# Patient Record
Sex: Male | Born: 1995 | Race: White | Hispanic: No | Marital: Single | Smoking: Never smoker
Health system: Southern US, Community
[De-identification: ages and names within clinical notes are randomized; demographics above are authoritative.]

## PROBLEM LIST (undated history)

## (undated) DIAGNOSIS — K2950 Unspecified chronic gastritis without bleeding: Secondary | ICD-10-CM

## (undated) DIAGNOSIS — J45909 Unspecified asthma, uncomplicated: Secondary | ICD-10-CM

## (undated) HISTORY — DX: Unspecified asthma, uncomplicated: J45.909

## (undated) HISTORY — PX: NO PAST SURGERIES: SHX2092

## (undated) HISTORY — DX: Unspecified chronic gastritis without bleeding: K29.50

---

## 2016-03-17 ENCOUNTER — Encounter: Payer: Self-pay | Admitting: Gastroenterology

## 2016-03-23 ENCOUNTER — Telehealth: Payer: Self-pay | Admitting: Gastroenterology

## 2016-03-23 NOTE — Telephone Encounter (Signed)
The pt's mom wanted to sure we have the records for the pt are here.  I have confirmed we do have the records and Shanda BumpsJessica will have access for the appt.

## 2016-03-27 ENCOUNTER — Encounter (INDEPENDENT_AMBULATORY_CARE_PROVIDER_SITE_OTHER): Payer: Self-pay

## 2016-03-27 ENCOUNTER — Ambulatory Visit (INDEPENDENT_AMBULATORY_CARE_PROVIDER_SITE_OTHER): Payer: 59 | Admitting: Gastroenterology

## 2016-03-27 ENCOUNTER — Encounter: Payer: Self-pay | Admitting: Gastroenterology

## 2016-03-27 ENCOUNTER — Other Ambulatory Visit: Payer: 59

## 2016-03-27 VITALS — BP 94/60 | HR 88 | Ht 67.75 in | Wt 128.5 lb

## 2016-03-27 DIAGNOSIS — R634 Abnormal weight loss: Secondary | ICD-10-CM | POA: Diagnosis not present

## 2016-03-27 DIAGNOSIS — K219 Gastro-esophageal reflux disease without esophagitis: Secondary | ICD-10-CM

## 2016-03-27 DIAGNOSIS — R109 Unspecified abdominal pain: Secondary | ICD-10-CM | POA: Diagnosis not present

## 2016-03-27 DIAGNOSIS — R197 Diarrhea, unspecified: Secondary | ICD-10-CM | POA: Diagnosis not present

## 2016-03-27 MED ORDER — DIPHENOXYLATE-ATROPINE 2.5-0.025 MG PO TABS: 1.0000 | ORAL_TABLET | Freq: Four times a day (QID) | ORAL | 0 refills | Status: AC | PRN

## 2016-03-27 MED ORDER — OMEPRAZOLE 40 MG PO CPDR: 40.0000 mg | DELAYED_RELEASE_CAPSULE | Freq: Two times a day (BID) | ORAL | 2 refills | Status: AC

## 2016-03-27 NOTE — Progress Notes (Signed)
03/27/2016 Roy Kelley [id number] 1996/01/05   HISTORY OF PRESENT ILLNESS:  This is a 21 year old male who is new to our office. [place] is from [place] and just underwent extensive GI evaluation there in January. He is a Consulting civil engineerstudent at [employer] so he is coming here to establish care for continued follow-up. His mother is with him today. We have records from his recent evaluation. He complains of diarrhea with associated weight loss of 25 pounds since October. Has abdominal pain both in lower abdomen and upper abdomen. Also has complaints of chest pain. He tells me that all of his symptoms started in the late fall. His evaluation includes an EGD that was performed on January 24 that showed mildly severe reflux esophagitis, chronic gastritis. It appears that biopsies were obtained from the esophagus and the stomach but I do not yet have those results. He was placed on omeprazole 40 mg twice daily and told to continue that for couple of months and then back down to once daily (has been on it for about a month now). CBC, CMP, TSH and remaining thyroid studies, sedimentation rate, CRP were all within normal limits. He also had IBD panel performed and that was negative. He did have a celiac panel drawn as well and that was somewhat abnormal. His transglutaminase IgA was slightly elevated at 5 but his transglutaminase IgG was elevated at 19 and his antigliadin antibody IgG was significantly elevated at 51. He also had an abdominal ultrasound performed that was unremarkable. Same day as his endoscopy he presented to the emergency department with complaints of severe abdominal and chest pain. He underwent a CT scan of the chest, abdomen, and pelvis. This showed mucosal thickening of a short segment of proximal jejunum compatible with enteritis, but was otherwise unremarkable. They thought that his upper abdominal pain and chest pain may have been a flare of his reflux type symptoms.  His abdominal pain is better  currently.  He says that every time he eats he has diarrhea.  He does smoke marijuana as well.  Patient admits to some nausea but denies any vomiting with his other symptoms.   Past Medical History:  Diagnosis Date  . Asthma   . Gastritis, chronic    No past surgical history on file.  reports that he has never smoked. He has never used smokeless tobacco. He reports that he drinks alcohol. He reports that he uses drugs, including Marijuana. family history includes Colon polyps in his father. Not on File    No outpatient encounter prescriptions on file as of 03/27/2016.   No facility-administered encounter medications on file as of 03/27/2016.     REVIEW OF SYSTEMS  : All other systems reviewed and negative except where noted in the History of Present Illness.  PHYSICAL EXAM: Ht 5' 7.75" (1.721 m) Comment: height measured without shoes  Wt 128 lb 8 oz (58.3 kg)   BMI 19.68 kg/m  General: Well developed white male in no acute distress Head: Normocephalic and atraumatic Eyes:  Sclerae anicteric, conjunctiva pink. Ears: Normal auditory acuity Lungs: Clear throughout to auscultation Heart: Regular rate and rhythm Abdomen: Soft, non-distended. Normal bowel sounds.  Mild epigastric TTP. Musculoskeletal: Symmetrical with no gross deformities  Skin: No lesions on visible extremities Extremities: No edema  Neurological: Alert oriented x 4, grossly non-focal Psychological:  Alert and cooperative. Normal mood and affect  ASSESSMENT AND PLAN: -21 year old male with complaints of abdominal pain, chest pain, diarrhea, and weight loss:  Had extensive evaluation in [place].  Had esophagitis on EGD and celiac labs slightly elevated.  Inflammatory markers, IBD serologies normal/negative.  CT scan suggested some thickening in the jejunum but remainder of the bowel was normal.  Celiac serologies actually slightly elevated.  We don't have the pathology results from his EGD so we are requesting those but  it doesn't appear that they obtained duodenal specimens.  At this point I am going to order stool studies to rule out Cdiff, parasites, and Giardia since he has not had any stool studies performed.  I am going to have him follow-up the CT scan with a SBS to see if there is resolution of the small bowel thickening.  Will await EGD biopsy results.  I would like him to follow a gluten free diet for the next several weeks to see if his symptoms improve/resolve.  He would like to see a dietician to discuss this further as well.  Can use Imodium prn for diarrhea pending stool studies are negative.  Will give a prescription for lomotil as well to use if needed. -GERD:  Continue prilose 40 mg BID for another month and then decreased to once daily.  *Will follow-up in approximately 8 weeks with Dr. Russella Dar.  CC:  No ref. provider found

## 2016-03-27 NOTE — Patient Instructions (Addendum)
If you are age 21 or older, your body mass index should be between 23-30. Your Body mass index is 19.68 kg/m. If this is out of the aforementioned range listed, please consider follow up with your Primary Care Provider.  If you are age 21 or younger, your body mass index should be between 19-25. Your Body mass index is 19.68 kg/m. If this is out of the aformentioned range listed, please consider follow up with your Primary Care Provider.   You have been scheduled for an Upper GI Series and Small Bowel Follow Thru at [facility]. Your appointment is on 04-14-2016 at 11am. Please arrive 15 minutes prior to your test for registration. Make certain not to have anything to eat or drink after midnight on the night before your test. If you need to reschedule, please contact radiology at [phone]. --------------------------------------------------------------------------------------------------------------- An upper GI series uses x rays to help diagnose problems of the upper GI tract, which includes the esophagus, stomach, and duodenum. The duodenum is the first part of the small intestine. An upper GI series is conducted by a radiology technologist or a radiologist-a doctor who specializes in x-ray imaging-at a hospital or outpatient center. While sitting or standing in front of an x-ray machine, the patient drinks barium liquid, which is often white and has a chalky consistency and taste. The barium liquid coats the lining of the upper GI tract and makes signs of disease show up more clearly on x rays. X-ray video, called fluoroscopy, is used to view the barium liquid moving through the esophagus, stomach, and duodenum. Additional x rays and fluoroscopy are performed while the patient lies on an x-ray table. To fully coat the upper GI tract with barium liquid, the technologist or radiologist may press on the abdomen or ask the patient to change position. Patients hold still in various positions,  allowing the technologist or radiologist to take x rays of the upper GI tract at different angles. If a technologist conducts the upper GI series, a radiologist will later examine the images to look for problems.  This test typically takes about 1 hour to complete --------------------------------------------------------------------------------------------------------------------------------------------- The Small Bowel Follow Thru examination is used to visualize the entire small bowel (intestines); specifically the connection between the small and large intestine. You will be positioned on a flat x-ray table and an image of your abdomen taken. Then the technologist will show the x-ray to the radiologist. The radiologist will instruct your technologist how much (1-2 cups) barium sulfate you will drink and when to begin taking the timed x-rays, usually 15-30 minutes after you begin drinking. Barium is a harmless substance that will highlight your small intestine by absorbing x-ray. The taste is chalky and it feels very heavy both in the cup and in your stomach.  After the first x-ray is taken and shown to the radiologist, he/she will determine when the next image is to be taken. This is repeated until the barium has reached the end of the small intestine and enters the beginning of the colon (cecum). At such time when the barium spills into the colon, you will be positioned on the x-ray table once again. The radiologist will use a fluoroscopic camera to take some detailed pictures of the connection between your small intestine and colon. The fluoroscope is an x-ray unit that works with a television/computer screen. The radiologist will apply pressure to your abdomen with his/her hand and a lead glove, a plastic paddle, or a paddle with an inflated rubber  balloon on the end. This is to spread apart your loops of intestine so he/she can see all areas.   This test typically takes around 1 hour to  complete.  Important----- Drink plenty of water (8-10 cups/day) for a few days following the procedure to avoid constipation and blockage. The barium will make your stools white for a few days. --------------------------------------------------------------------------------------------------------------------------------------------  We will send your records to the [facility]. They will call to schedule your appointment.  Please read over Gluten free diet.  Thank you for choosing [facility]

## 2016-03-28 ENCOUNTER — Telehealth: Payer: Self-pay

## 2016-03-28 ENCOUNTER — Other Ambulatory Visit: Payer: Self-pay

## 2016-03-28 DIAGNOSIS — K219 Gastro-esophageal reflux disease without esophagitis: Secondary | ICD-10-CM

## 2016-03-28 DIAGNOSIS — R197 Diarrhea, unspecified: Secondary | ICD-10-CM

## 2016-03-28 NOTE — Telephone Encounter (Signed)
Referral request sent on 03-28-2016. Will await appointment information.

## 2016-03-29 ENCOUNTER — Encounter: Payer: Self-pay | Admitting: Gastroenterology

## 2016-03-29 DIAGNOSIS — K219 Gastro-esophageal reflux disease without esophagitis: Secondary | ICD-10-CM | POA: Insufficient documentation

## 2016-03-29 DIAGNOSIS — R634 Abnormal weight loss: Secondary | ICD-10-CM | POA: Insufficient documentation

## 2016-03-29 DIAGNOSIS — R197 Diarrhea, unspecified: Secondary | ICD-10-CM | POA: Insufficient documentation

## 2016-03-29 DIAGNOSIS — R109 Unspecified abdominal pain: Secondary | ICD-10-CM | POA: Insufficient documentation

## 2016-04-04 ENCOUNTER — Ambulatory Visit (HOSPITAL_COMMUNITY): Payer: 59

## 2016-04-06 NOTE — Telephone Encounter (Signed)
Pt. is scheduled to see the dietician on 04-12-2016.

## 2016-04-12 ENCOUNTER — Ambulatory Visit: Payer: 59 | Admitting: Registered"

## 2016-04-14 ENCOUNTER — Ambulatory Visit (HOSPITAL_COMMUNITY)
Admission: RE | Admit: 2016-04-14 | Discharge: 2016-04-14 | Disposition: A | Discharge status: Home / Self Care | Payer: 59 | Source: Ambulatory Visit | Attending: Gastroenterology | Admitting: Gastroenterology

## 2016-04-14 DIAGNOSIS — R197 Diarrhea, unspecified: Secondary | ICD-10-CM | POA: Diagnosis present

## 2016-04-14 DIAGNOSIS — R634 Abnormal weight loss: Secondary | ICD-10-CM

## 2016-04-14 DIAGNOSIS — K219 Gastro-esophageal reflux disease without esophagitis: Secondary | ICD-10-CM | POA: Diagnosis present

## 2016-04-14 DIAGNOSIS — R109 Unspecified abdominal pain: Secondary | ICD-10-CM | POA: Insufficient documentation

## 2016-05-22 ENCOUNTER — Ambulatory Visit: Payer: 59 | Admitting: Gastroenterology

## 2016-05-22 ENCOUNTER — Telehealth: Payer: Self-pay

## 2016-05-22 NOTE — Telephone Encounter (Signed)
-----   Message from Maudry Mayhew sent at 05/22/2016 10:38 AM EDT ----- Regarding: Pt canceled appt for today 4.16.18  pt states he has been vommiting all morning and can not make it to appt today...will cb once he checks his school sch

## 2016-05-22 NOTE — Telephone Encounter (Signed)
No charge this time. 

## 2016-05-22 NOTE — Telephone Encounter (Signed)
Do you want to charge? 

## 2016-12-22 ENCOUNTER — Emergency Department: Payer: 59

## 2016-12-22 ENCOUNTER — Emergency Department
Admission: EM | Admit: 2016-12-22 | Discharge: 2016-12-22 | Disposition: A | Discharge status: Home / Self Care | Payer: 59 | Attending: Emergency Medicine | Admitting: Emergency Medicine

## 2016-12-22 ENCOUNTER — Encounter: Payer: Self-pay | Admitting: Emergency Medicine

## 2016-12-22 ENCOUNTER — Other Ambulatory Visit: Payer: Self-pay

## 2016-12-22 DIAGNOSIS — J45909 Unspecified asthma, uncomplicated: Secondary | ICD-10-CM | POA: Diagnosis not present

## 2016-12-22 DIAGNOSIS — R1012 Left upper quadrant pain: Secondary | ICD-10-CM | POA: Diagnosis present

## 2016-12-22 LAB — COMPREHENSIVE METABOLIC PANEL
ALT: 24 U/L (ref 17–63)
AST: 22 U/L (ref 15–41)
Albumin: 4.9 g/dL (ref 3.5–5.0)
Alkaline Phosphatase: 82 U/L (ref 38–126)
Anion gap: 7 (ref 5–15)
BUN: 14 mg/dL (ref 6–20)
CO2: 27 mmol/L (ref 22–32)
Calcium: 9.2 mg/dL (ref 8.9–10.3)
Chloride: 102 mmol/L (ref 101–111)
Creatinine, Ser: 0.73 mg/dL (ref 0.61–1.24)
GFR calc Af Amer: >60 mL/min (ref >60)
GFR calc non Af Amer: >60 mL/min (ref >60)
Glucose, Bld: 96 mg/dL (ref 65–99)
Potassium: 3.9 mmol/L (ref 3.5–5.1)
Sodium: 136 mmol/L (ref 135–145)
Total Bilirubin: 0.5 mg/dL (ref 0.3–1.2)
Total Protein: 8.3 g/dL — ABNORMAL HIGH (ref 6.5–8.1)

## 2016-12-22 LAB — CBC WITH DIFFERENTIAL/PLATELET
Basophils Absolute: 0 10*3/uL (ref 0–0.1)
Basophils Relative: 1 %
Eosinophils Absolute: 0.1 10*3/uL (ref 0–0.7)
Eosinophils Relative: 2 %
HCT: 44 % (ref 40.0–52.0)
Hemoglobin: 14.5 g/dL (ref 13.0–18.0)
Lymphocytes Relative: 26 %
Lymphs Abs: 1.8 10*3/uL (ref 1.0–3.6)
MCH: 29.9 pg (ref 26.0–34.0)
MCHC: 32.9 g/dL (ref 32.0–36.0)
MCV: 90.9 fL (ref 80.0–100.0)
Monocytes Absolute: 0.6 10*3/uL (ref 0.2–1.0)
Monocytes Relative: 9 %
Neutro Abs: 4.4 10*3/uL (ref 1.4–6.5)
Neutrophils Relative %: 62 %
Platelets: 301 10*3/uL (ref 150–440)
RBC: 4.83 MIL/uL (ref 4.40–5.90)
RDW: 13.4 % (ref 11.5–14.5)
WBC: 6.9 10*3/uL (ref 3.8–10.6)

## 2016-12-22 LAB — MONONUCLEOSIS SCREEN: Mono Screen: NEGATIVE

## 2016-12-22 MED ORDER — IOPAMIDOL (ISOVUE-300) INJECTION 61%
100.0000 mL | Freq: Once | INTRAVENOUS | Status: AC | PRN
  Administered 2016-12-22: 100 mL via INTRAVENOUS

## 2016-12-22 MED ORDER — SODIUM CHLORIDE 0.9 % IV BOLUS (SEPSIS)
1000.0000 mL | Freq: Once | INTRAVENOUS | Status: AC
  Administered 2016-12-22: 1000 mL via INTRAVENOUS

## 2016-12-22 MED ORDER — SODIUM CHLORIDE 0.9 % IV SOLN: 1000.0000 mL | INTRAVENOUS | Status: DC

## 2016-12-22 NOTE — ED Triage Notes (Signed)
Arrives c/o fatigue for the past few weeks.  Also c/o left upper quadrant pain x 2 weeks.  [facility], who sent patient for ED evaluation due to enlarged spleen.  Patient c/o generalized pain and aches.  Also back pain.  Patient [place] to fly to [place] tonight.

## 2016-12-22 NOTE — ED Provider Notes (Signed)
[facility] Emergency Department Provider Note   ____________________________________________    I have reviewed the triage vital signs and the nursing notes.   HISTORY  Chief Complaint Abdominal Pain    HPI Roy Kelley is a 21 y.o. male who presents with complaints of left upper abdominal pain and fatigue.  Patient reports 2-4 weeks of significant fatigue.  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  He has had intermittent sore throats as well.  Today he went to urgent care and was recommended to come to the emergency department to evaluate whether his spleen is enlarged.  He describes aching pain in his left upper quadrant which occasionally radiates to his left mid back.  Sore throat today, intermittent fevers.  No nausea or vomiting.  Normal stools.  Scheduled to travel to [place] today   Past Medical History:  Diagnosis Date  . Asthma   . Gastritis, chronic     Patient Active Problem List   Diagnosis Date Noted  . Diarrhea 03/29/2016  . Abdominal pain 03/29/2016  . Loss of weight 03/29/2016  . Gastroesophageal reflux disease 03/29/2016    Past Surgical History:  Procedure Laterality Date  . NO PAST SURGERIES      Prior to Admission medications   Medication Sig Start Date End Date Taking? Authorizing Provider  diphenoxylate-atropine (LOMOTIL) 2.5-0.025 MG tablet Take 1 tablet by mouth every 6 (six) hours as needed for diarrhea or loose stools. Patient not taking: Reported on 12/22/2016 03/27/16   Zehr, Princella PellegriniJessica D, PA-C  omeprazole (PRILOSEC) 40 MG capsule Take 1 capsule (40 mg total) by mouth 2 (two) times daily. Patient not taking: Reported on 12/22/2016 03/27/16   Zehr, Princella PellegriniJessica D, PA-C     Allergies Patient has no known allergies.  Family History  Problem Relation Age of Onset  . Colon polyps Father   . Stroke Paternal Grandmother   . Breast cancer Paternal Grandmother     Social History Social History   Tobacco Use  . Smoking status:  Never Smoker  . Smokeless tobacco: Never Used  Substance Use Topics  . Alcohol use: Yes    Comment: occasional  . Drug use: Yes    Types: Marijuana    Review of Systems  Constitutional: Some chills Eyes: No visual changes.  ENT: As above. Cardiovascular: Denies chest pain. Respiratory: Denies shortness of breath. Gastrointestinal:   No nausea, no vomiting.   Genitourinary: Negative for dysuria.  No hematuria, no urinary symptoms Musculoskeletal: As above Skin: Negative for rash. Neurological: Negative for headaches   ____________________________________________   PHYSICAL EXAM:  VITAL SIGNS: ED Triage Vitals  Enc Vitals Group     BP 12/22/16 1020 118/72     Pulse Rate 12/22/16 1020 73     Resp 12/22/16 1020 18     Temp 12/22/16 1020 97.8 F (36.6 C)     Temp Source 12/22/16 1020 Oral     SpO2 12/22/16 1020 98 %     Weight 12/22/16 1025 63.5 kg (140 lb)     Height 12/22/16 1025 1.727 m (5\' 8" )     Head Circumference --      Peak Flow --      Pain Score 12/22/16 1025 6     Pain Loc --      Pain Edu? --      Excl. in GC? --     Constitutional: Alert and oriented. No acute distress. Pleasant and interactive Eyes: Conjunctivae are normal.   Nose: No  congestion/rhinnorhea. Mouth/Throat: Mucous membranes are moist.   Neck:  Painless ROM Cardiovascular: Normal rate, regular rhythm. Grossly normal heart sounds.  Good peripheral circulation. Respiratory: Normal respiratory effort.  No retractions. Lungs CTAB. Gastrointestinal: Mild tenderness left upper quadrant, slightly enlarged spleen. no distention.  No CVA tenderness. Genitourinary: deferred Musculoskeletal:   Warm and well perfused Neurologic:  Normal speech and language. No gross focal neurologic deficits are appreciated.  Skin:  Skin is warm, dry and intact. No rash noted. Psychiatric: Mood and affect are normal. Speech and behavior are normal.  ____________________________________________   LABS (all  labs ordered are listed, but only abnormal results are displayed)  Labs Reviewed  COMPREHENSIVE METABOLIC PANEL - Abnormal; Notable for the following components:      Result Value   Total Protein 8.3 (*)    All other components within normal limits  CBC WITH DIFFERENTIAL/PLATELET  MONONUCLEOSIS SCREEN   ____________________________________________  EKG  None ____________________________________________  RADIOLOGY  Ct abd/pel negative ____________________________________________   PROCEDURES  Procedure(s) performed: No  Procedures   Critical Care performed: No ____________________________________________   INITIAL IMPRESSION / ASSESSMENT AND PLAN / ED COURSE  Pertinent labs & imaging results that were available during my care of the patient were reviewed by me and considered in my medical decision making (see chart for details).  Patient overall well-appearing in no acute distress.  Labs overall well-appearing, mono screen pending.  Certainly clinical picture and H&P is consistent with mononucleosis, will give IV fluids, check urine and chest x-ray as well.  May require imaging  ----------------------------------------- 1:10 PM on 12/22/2016 -----------------------------------------  Mono screen negative, still with some discomfort in the left upper abdomen, will image for further evaluation  ----------------------------------------- 1:45 PM on 12/22/2016 -----------------------------------------  CT abdomen pelvis is normal.  Patient feels well remains very well-appearing.  Presentation is strongly consistent with mononucleosis/viral illness.  Recommend supportive care, outpatient follow-up, return to ED if any worsening.    ____________________________________________   FINAL CLINICAL IMPRESSION(S) / ED DIAGNOSES  Final diagnoses:  Left upper quadrant pain      NEW MEDICATIONS STARTED DURING THIS VISIT:  This SmartLink is deprecated. Use AVSMEDLIST  instead to display the medication list for a patient.   Note:  This document was prepared using Dragon voice recognition software and may include unintentional dictation errors.    Jene EveryKinner, Betzayda Braxton, MD 12/22/16 1346

## 2016-12-22 NOTE — Discharge Instructions (Addendum)
Please follow [facility] with [facility]. Return to the ED if your symptoms worsen.   Your blood work and CT scan were entirely normal today. Your mono screen was negative.

## 2016-12-22 NOTE — ED Notes (Signed)
Patient does not appear to be in any acute distress at time of discharge. Patient ambulatory to lobby with steady gate. Patient denies any comments or concerns regarding discharge.  

## 2019-02-21 IMAGING — CR DG CHEST 2V
2 series · 2 of 2 positions shown · non-contrast
Comparison: No recent prior .

CLINICAL DATA: Week scratched it weakness.  Upper abdominal pain.

EXAM:
CHEST  2 VIEW

[chest pa]
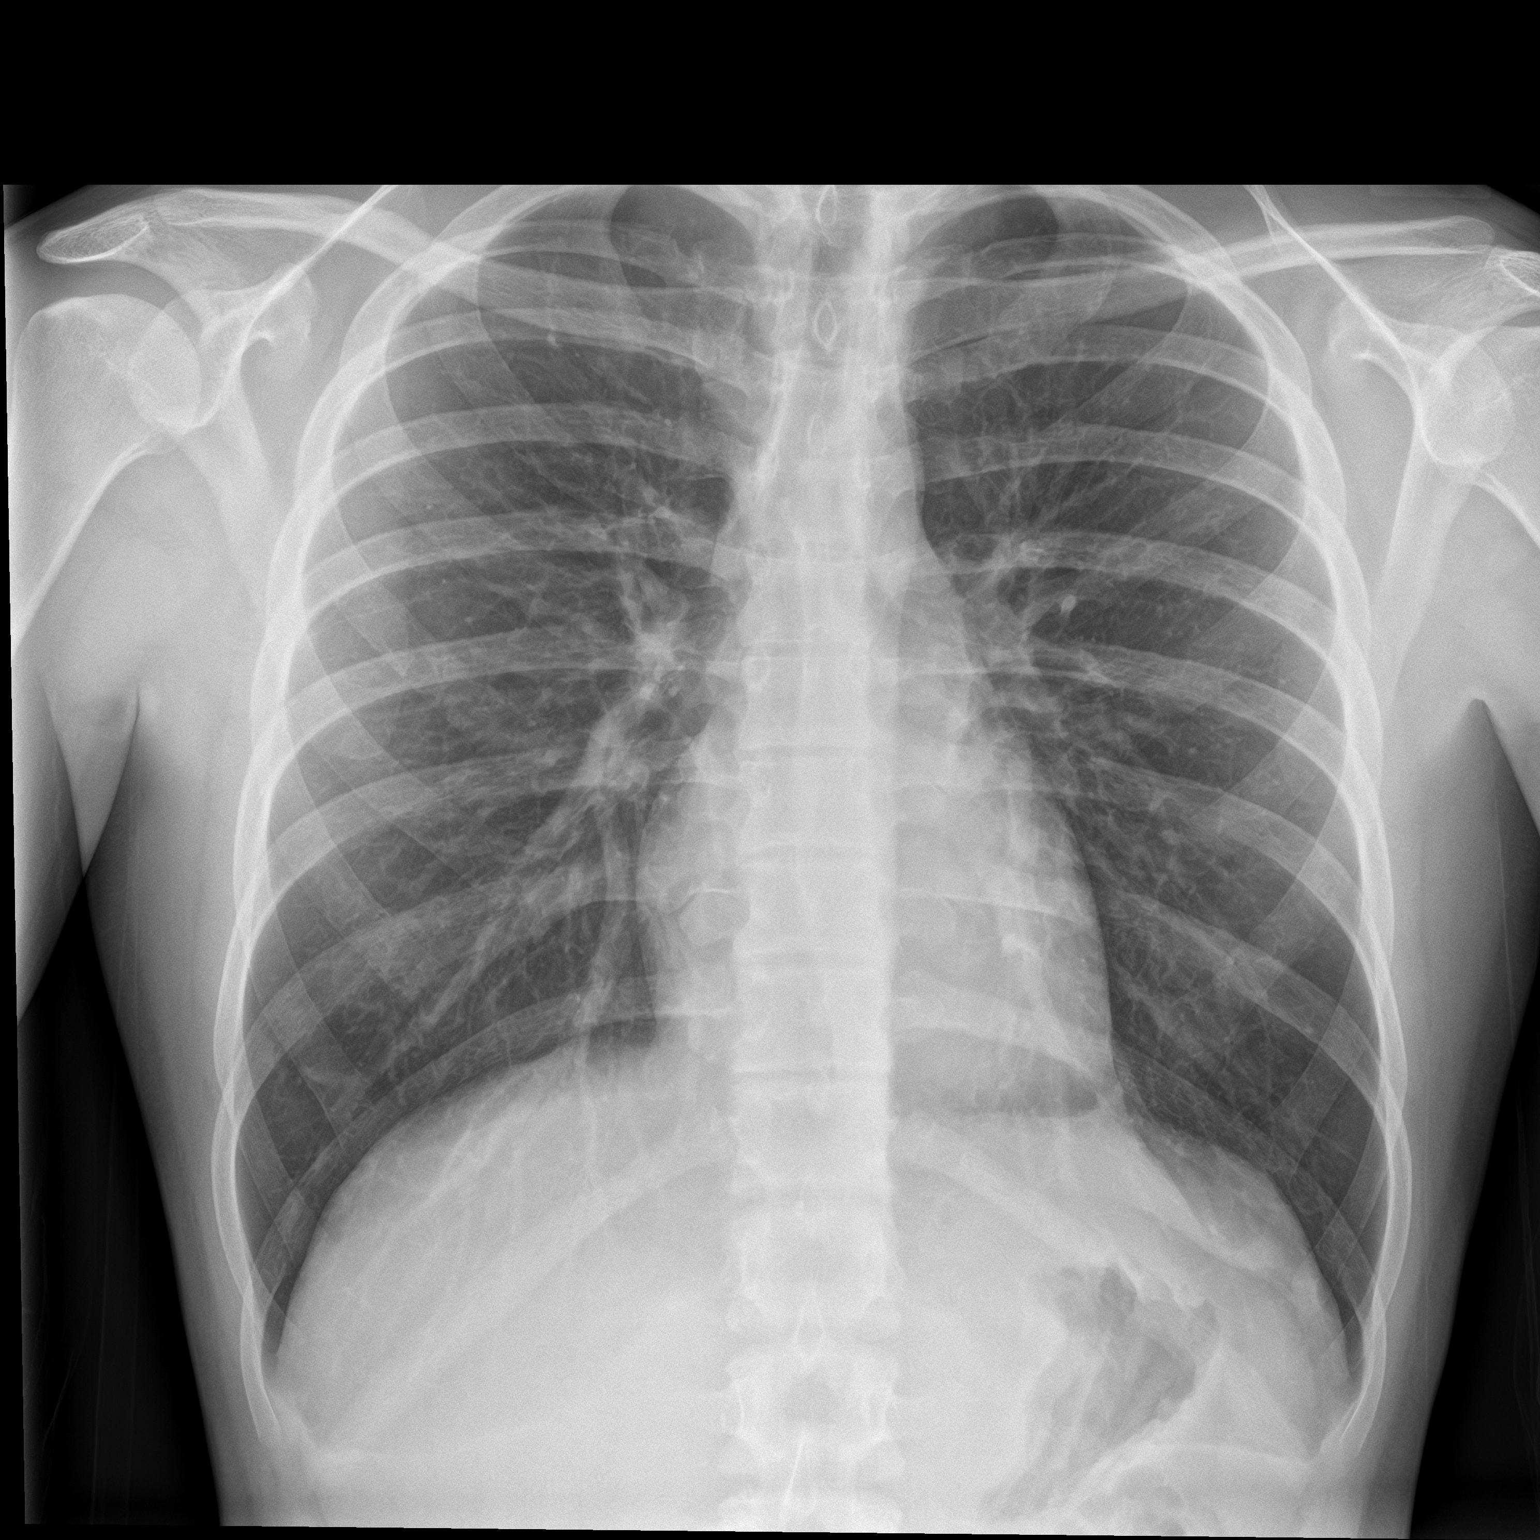

[chest lat]
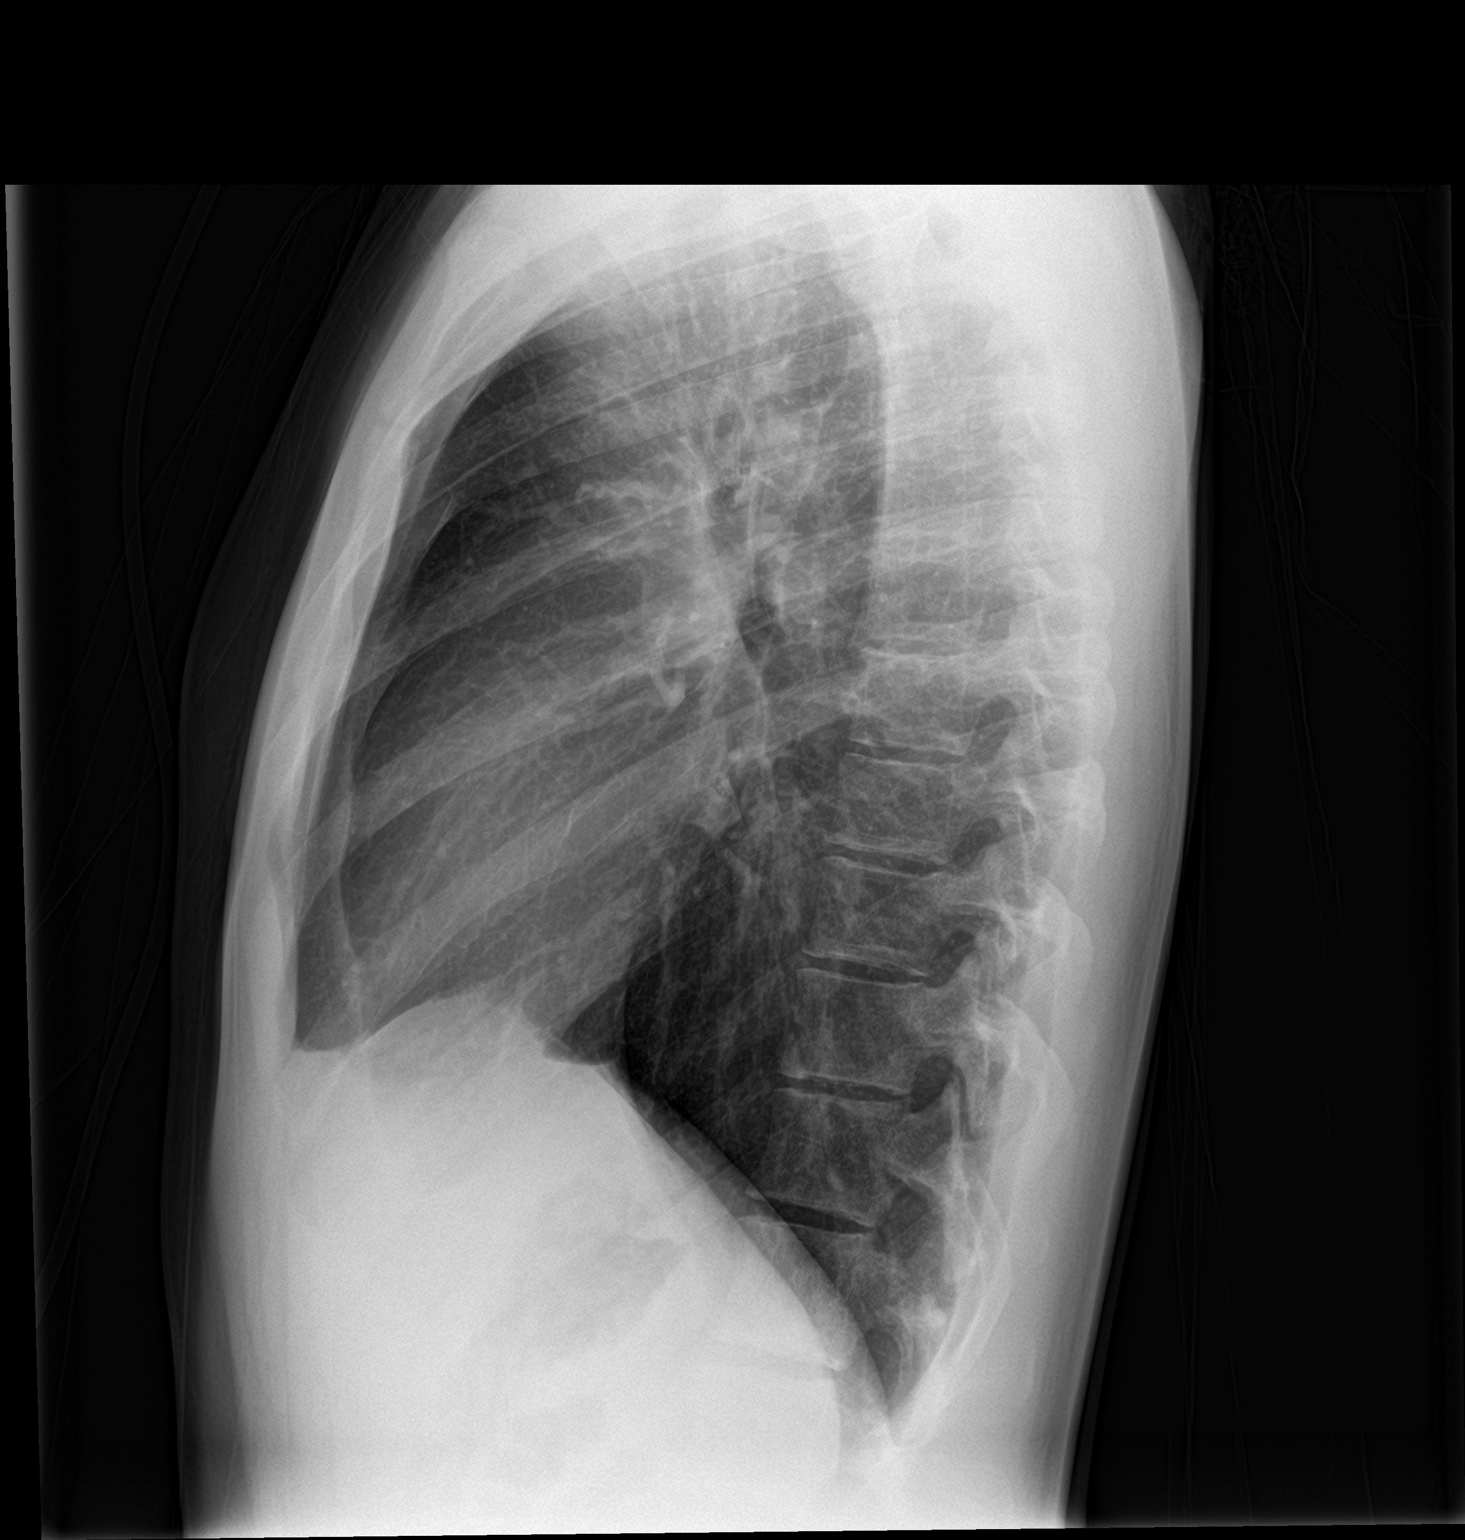

[2 of 2 positions shown; findings below may reference images not displayed]

FINDINGS: Mediastinum hilar structures normal. Lungs are clear. Heart size
normal. No pleural effusion or pneumothorax. Degenerative changes
thoracic spine .
IMPRESSION: No acute cardiopulmonary disease.

## 2019-02-21 IMAGING — CT CT ABD-PELV W/ CM
2 of 4 series · 15 of 46 positions shown, 17 images · IV contrast (APPLIED)
Comparison: None.

CLINICAL DATA: Left upper quadrant pain for 2 weeks.

EXAM:
CT ABDOMEN AND PELVIS WITH CONTRAST
TECHNIQUE: Multidetector CT imaging of the abdomen and pelvis was performed
using the standard protocol following bolus administration of
intravenous contrast.
CONTRAST:  100mL A71IIJ-XFF IOPAMIDOL (A71IIJ-XFF) INJECTION 61%

[Series 2: axial st · axial · 0.61mm/px · z∈[-934,-494]mm · 12 of 98 slices shown, 14 images]
[im 5/98  soft-tissue]
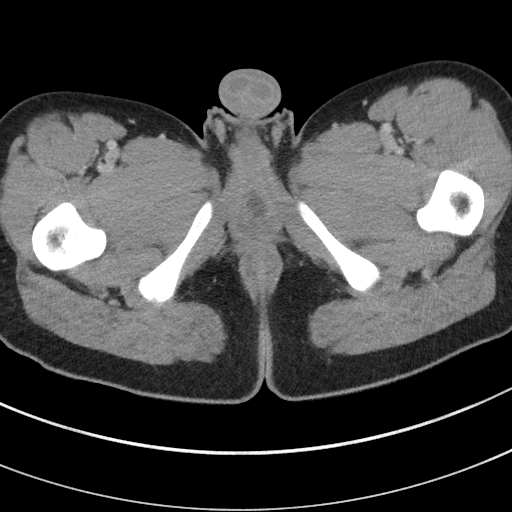
[im 5/98  bone]
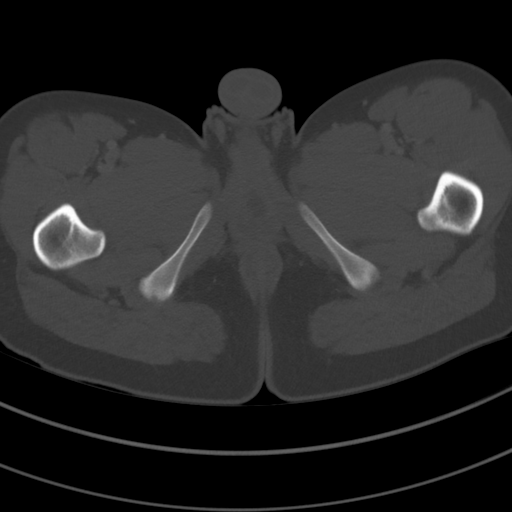
[im 13/98  soft-tissue]
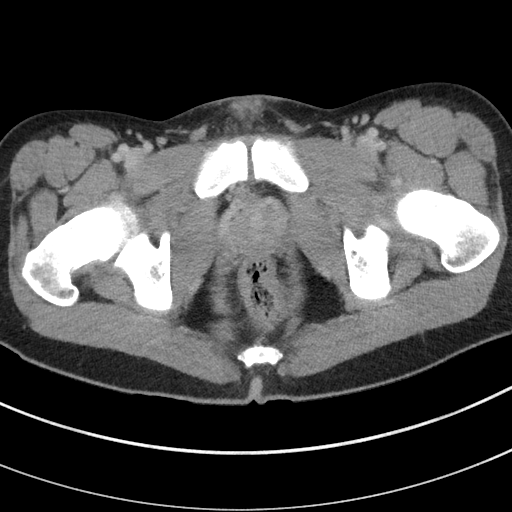
[im 21/98  soft-tissue]
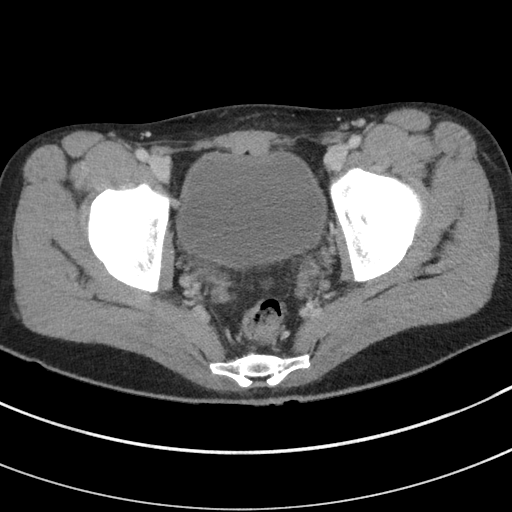
[im 29/98  soft-tissue]
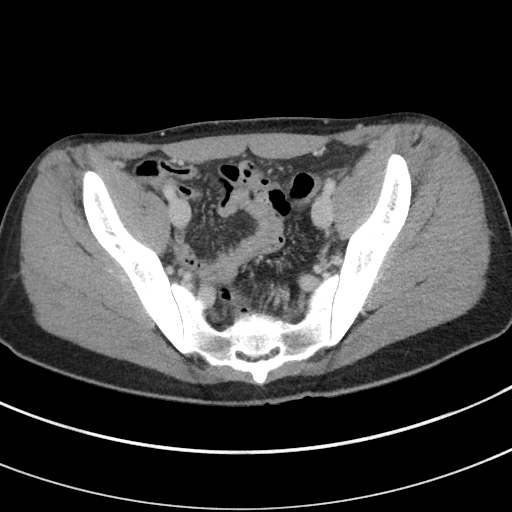
[im 37/98  soft-tissue]
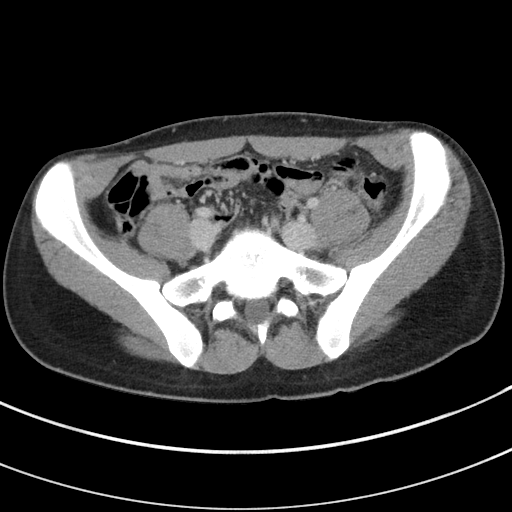
[im 45/98  soft-tissue]
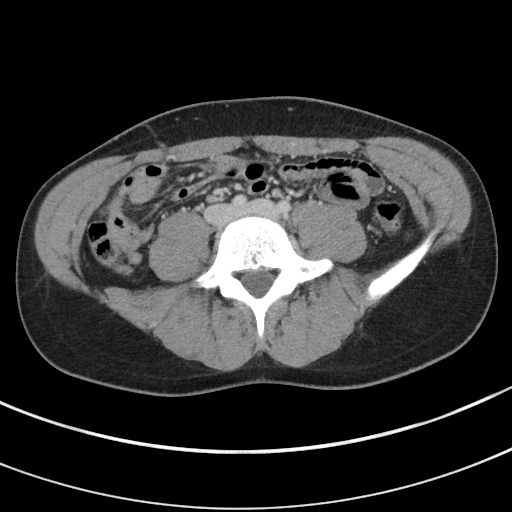
[im 53/98  soft-tissue]
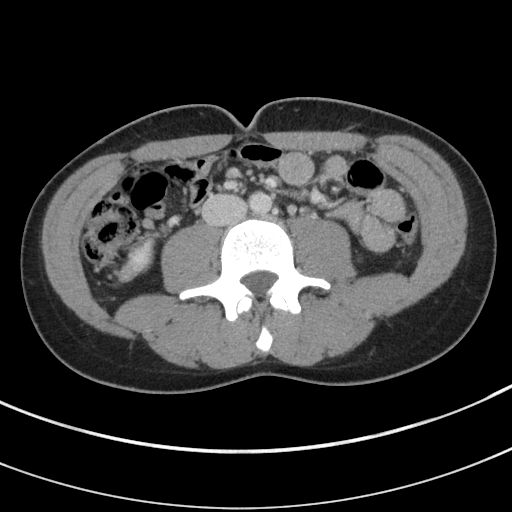
[im 61/98  soft-tissue]
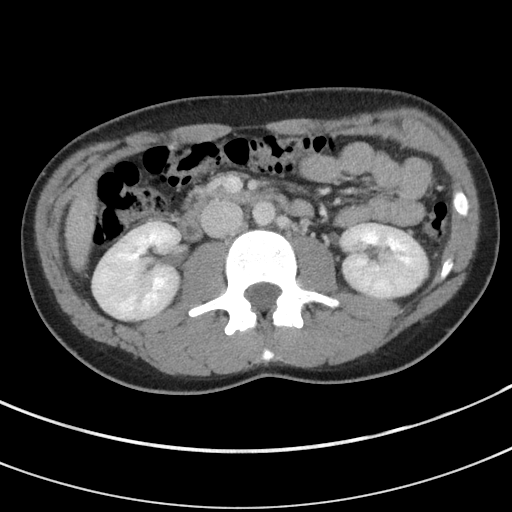
[im 69/98  soft-tissue]
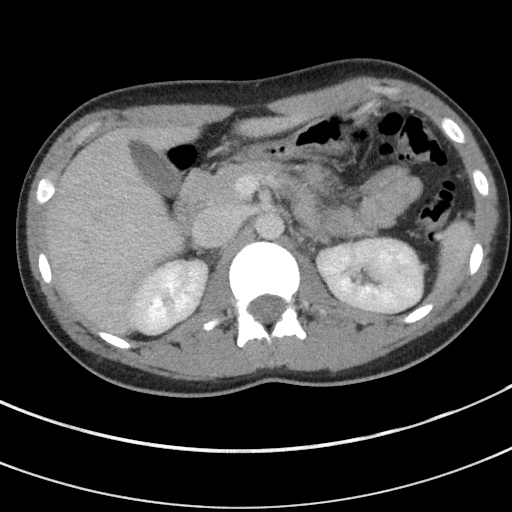
[im 69/98  bone]
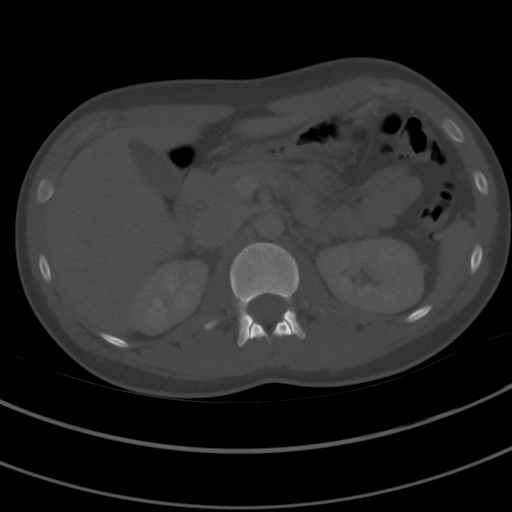
[im 77/98  soft-tissue]
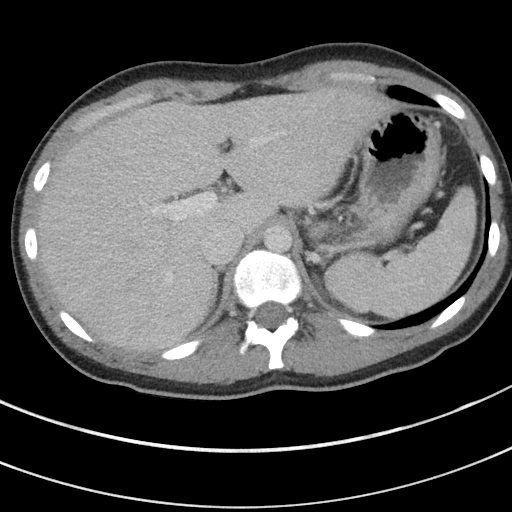
[im 85/98  soft-tissue]
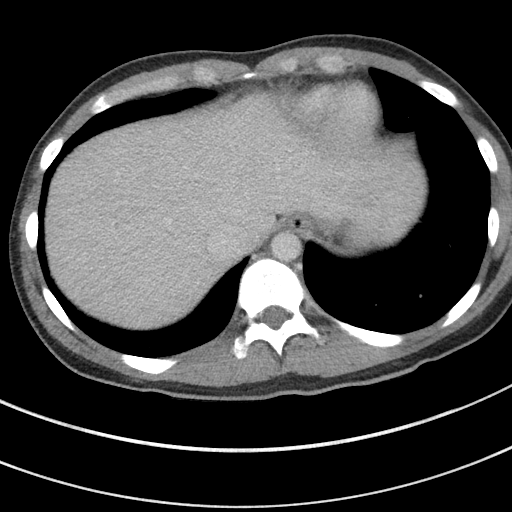
[im 93/98  soft-tissue]
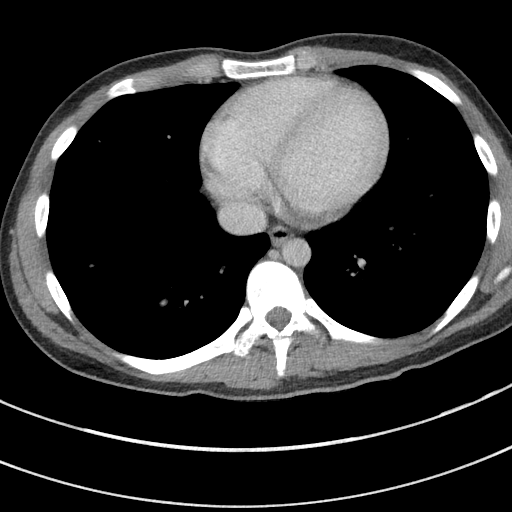

[Series 5: coronal st · coronal · 0.63mm/px · 3 of 75 slices shown]
[im 25/75  soft-tissue]
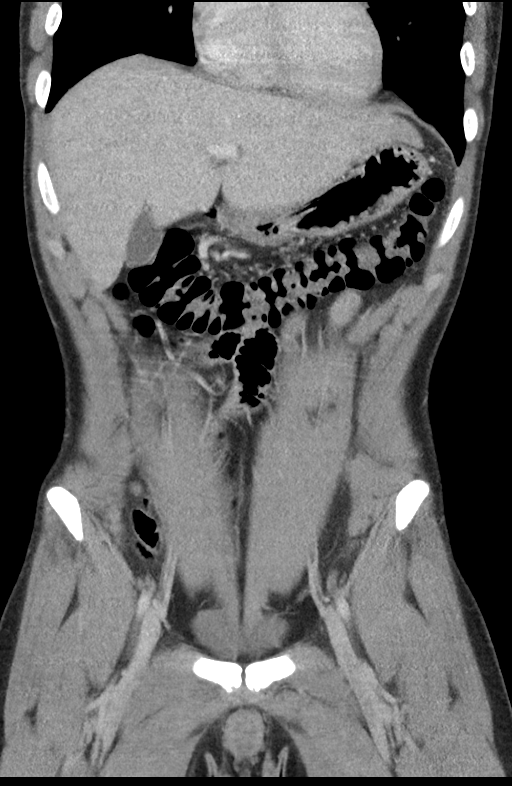
[im 33/75  soft-tissue]
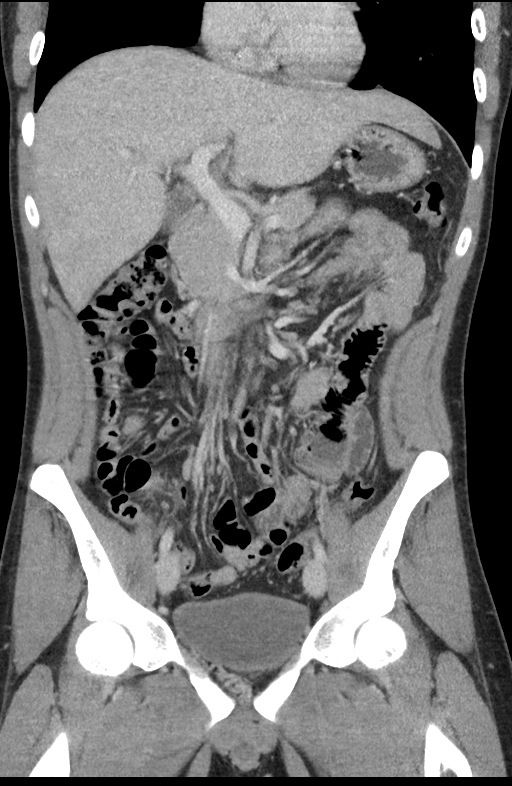
[im 42/75  soft-tissue]
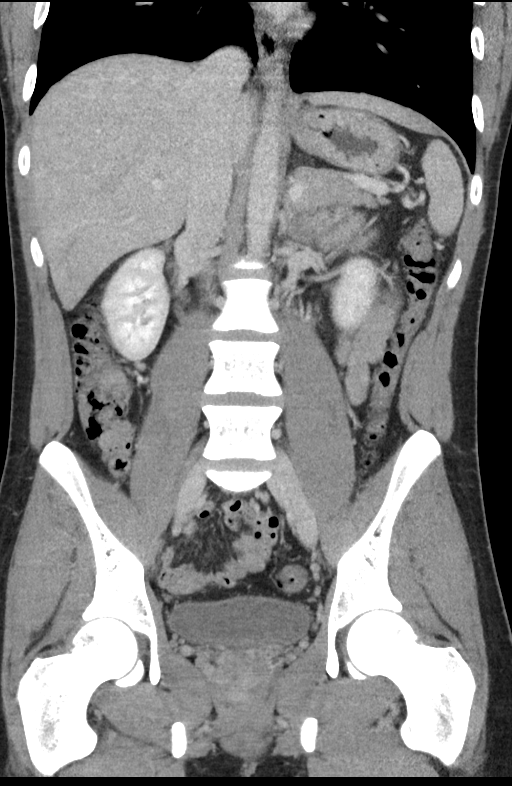

[15 of 46 positions shown; findings below may reference images not displayed]

FINDINGS: Lower chest: No acute abnormality.

Hepatobiliary: No focal liver abnormality is seen. No gallstones,
gallbladder wall thickening, or biliary dilatation.

Pancreas: Unremarkable. No pancreatic ductal dilatation or
surrounding inflammatory changes.

Spleen: Normal in size without focal abnormality.

Adrenals/Urinary Tract: Adrenal glands are unremarkable. Kidneys are
normal, without renal calculi, focal lesion, or hydronephrosis.
Bladder is unremarkable.

Stomach/Bowel: Stomach is within normal limits. Appendix appears
normal. Diverticulosis without evidence diverticulitis. No evidence
of bowel wall thickening, distention, or inflammatory changes.

Vascular/Lymphatic: No significant vascular findings are present. No
enlarged abdominal or pelvic lymph nodes.

Reproductive: Prostate is unremarkable.

Other: No abdominal wall hernia or abnormality. No abdominopelvic
ascites.

Musculoskeletal: No acute or significant osseous findings.
IMPRESSION: 1. No acute abdominal or pelvic pathology.
